# Patient Record
Sex: Female | Born: 1965 | Race: White | Hispanic: No | Marital: Married
Health system: Southern US, Community
[De-identification: ages and names within clinical notes are randomized; demographics above are authoritative.]

---

## 2004-05-31 ENCOUNTER — Ambulatory Visit: Payer: Self-pay

## 2004-10-06 ENCOUNTER — Ambulatory Visit: Payer: Self-pay | Admitting: Internal Medicine

## 2004-11-21 ENCOUNTER — Other Ambulatory Visit: Payer: Self-pay

## 2004-11-24 ENCOUNTER — Ambulatory Visit: Payer: Self-pay | Admitting: Obstetrics and Gynecology

## 2005-06-06 ENCOUNTER — Emergency Department: Payer: Self-pay | Admitting: Emergency Medicine

## 2005-06-06 ENCOUNTER — Other Ambulatory Visit: Payer: Self-pay

## 2005-06-07 ENCOUNTER — Ambulatory Visit: Payer: Self-pay | Admitting: Unknown Physician Specialty

## 2005-07-09 ENCOUNTER — Ambulatory Visit: Payer: Self-pay | Admitting: Gastroenterology

## 2006-05-09 ENCOUNTER — Other Ambulatory Visit: Payer: Self-pay

## 2006-05-17 ENCOUNTER — Ambulatory Visit: Payer: Self-pay | Admitting: Obstetrics and Gynecology

## 2006-08-28 ENCOUNTER — Ambulatory Visit: Payer: Self-pay | Admitting: Obstetrics and Gynecology

## 2007-03-10 ENCOUNTER — Emergency Department: Payer: Self-pay | Admitting: Internal Medicine

## 2007-03-12 ENCOUNTER — Ambulatory Visit: Payer: Self-pay

## 2007-04-29 ENCOUNTER — Ambulatory Visit: Payer: Self-pay | Admitting: Specialist

## 2007-04-29 ENCOUNTER — Other Ambulatory Visit: Payer: Self-pay

## 2007-05-05 ENCOUNTER — Ambulatory Visit: Payer: Self-pay | Admitting: Specialist

## 2007-09-12 ENCOUNTER — Ambulatory Visit: Payer: Self-pay | Admitting: Obstetrics and Gynecology

## 2008-11-29 ENCOUNTER — Ambulatory Visit: Payer: Self-pay | Admitting: Obstetrics and Gynecology

## 2010-03-13 ENCOUNTER — Ambulatory Visit: Payer: Self-pay | Admitting: Obstetrics and Gynecology

## 2011-05-03 ENCOUNTER — Ambulatory Visit: Payer: Self-pay | Admitting: Obstetrics and Gynecology

## 2013-01-26 ENCOUNTER — Ambulatory Visit: Payer: Self-pay | Admitting: Internal Medicine

## 2013-02-02 ENCOUNTER — Ambulatory Visit: Payer: Self-pay | Admitting: Internal Medicine

## 2014-02-03 ENCOUNTER — Ambulatory Visit: Payer: Self-pay | Admitting: Internal Medicine

## 2015-07-27 DIAGNOSIS — E119 Type 2 diabetes mellitus without complications: Secondary | ICD-10-CM | POA: Diagnosis not present

## 2015-09-27 DIAGNOSIS — F411 Generalized anxiety disorder: Secondary | ICD-10-CM | POA: Diagnosis not present

## 2015-09-27 DIAGNOSIS — E119 Type 2 diabetes mellitus without complications: Secondary | ICD-10-CM | POA: Diagnosis not present

## 2015-09-27 DIAGNOSIS — K219 Gastro-esophageal reflux disease without esophagitis: Secondary | ICD-10-CM | POA: Diagnosis not present

## 2015-09-27 DIAGNOSIS — I10 Essential (primary) hypertension: Secondary | ICD-10-CM | POA: Diagnosis not present

## 2015-09-28 ENCOUNTER — Other Ambulatory Visit: Payer: Self-pay | Admitting: Internal Medicine

## 2015-09-28 DIAGNOSIS — Z1231 Encounter for screening mammogram for malignant neoplasm of breast: Secondary | ICD-10-CM

## 2015-10-05 ENCOUNTER — Other Ambulatory Visit: Payer: Self-pay | Admitting: Internal Medicine

## 2015-10-05 ENCOUNTER — Ambulatory Visit
Admission: RE | Admit: 2015-10-05 | Discharge: 2015-10-05 | Disposition: A | Discharge status: Home / Self Care | Payer: 59 | Source: Ambulatory Visit | Attending: Internal Medicine | Admitting: Internal Medicine

## 2015-10-05 DIAGNOSIS — Z1231 Encounter for screening mammogram for malignant neoplasm of breast: Secondary | ICD-10-CM

## 2015-10-28 DIAGNOSIS — E119 Type 2 diabetes mellitus without complications: Secondary | ICD-10-CM | POA: Diagnosis not present

## 2015-10-28 DIAGNOSIS — Z79899 Other long term (current) drug therapy: Secondary | ICD-10-CM | POA: Diagnosis not present

## 2015-10-28 DIAGNOSIS — I10 Essential (primary) hypertension: Secondary | ICD-10-CM | POA: Diagnosis not present

## 2016-04-03 DIAGNOSIS — Z0000 Encounter for general adult medical examination without abnormal findings: Secondary | ICD-10-CM | POA: Diagnosis not present

## 2016-04-03 DIAGNOSIS — Z79899 Other long term (current) drug therapy: Secondary | ICD-10-CM | POA: Diagnosis not present

## 2016-04-03 DIAGNOSIS — E6609 Other obesity due to excess calories: Secondary | ICD-10-CM | POA: Diagnosis not present

## 2016-04-03 DIAGNOSIS — E119 Type 2 diabetes mellitus without complications: Secondary | ICD-10-CM | POA: Diagnosis not present

## 2016-04-03 DIAGNOSIS — Z13220 Encounter for screening for lipoid disorders: Secondary | ICD-10-CM | POA: Diagnosis not present

## 2016-04-03 DIAGNOSIS — I10 Essential (primary) hypertension: Secondary | ICD-10-CM | POA: Diagnosis not present

## 2017-03-19 ENCOUNTER — Other Ambulatory Visit: Payer: Self-pay | Admitting: Internal Medicine

## 2017-03-19 DIAGNOSIS — Z1231 Encounter for screening mammogram for malignant neoplasm of breast: Secondary | ICD-10-CM

## 2017-03-20 DIAGNOSIS — I10 Essential (primary) hypertension: Secondary | ICD-10-CM | POA: Diagnosis not present

## 2017-03-20 DIAGNOSIS — E7800 Pure hypercholesterolemia, unspecified: Secondary | ICD-10-CM | POA: Diagnosis not present

## 2017-03-20 DIAGNOSIS — Z79899 Other long term (current) drug therapy: Secondary | ICD-10-CM | POA: Diagnosis not present

## 2017-03-20 DIAGNOSIS — F329 Major depressive disorder, single episode, unspecified: Secondary | ICD-10-CM | POA: Diagnosis not present

## 2017-03-20 DIAGNOSIS — E119 Type 2 diabetes mellitus without complications: Secondary | ICD-10-CM | POA: Diagnosis not present

## 2017-03-20 DIAGNOSIS — K219 Gastro-esophageal reflux disease without esophagitis: Secondary | ICD-10-CM | POA: Diagnosis not present

## 2017-03-22 DIAGNOSIS — Z1211 Encounter for screening for malignant neoplasm of colon: Secondary | ICD-10-CM | POA: Diagnosis not present

## 2017-04-09 ENCOUNTER — Ambulatory Visit
Admission: RE | Admit: 2017-04-09 | Discharge: 2017-04-09 | Disposition: A | Discharge status: Home / Self Care | Payer: 59 | Source: Ambulatory Visit | Attending: Internal Medicine | Admitting: Internal Medicine

## 2017-04-09 DIAGNOSIS — Z1231 Encounter for screening mammogram for malignant neoplasm of breast: Secondary | ICD-10-CM | POA: Insufficient documentation

## 2017-04-29 DIAGNOSIS — H5213 Myopia, bilateral: Secondary | ICD-10-CM | POA: Diagnosis not present

## 2017-09-25 DIAGNOSIS — I10 Essential (primary) hypertension: Secondary | ICD-10-CM | POA: Diagnosis not present

## 2017-09-25 DIAGNOSIS — E119 Type 2 diabetes mellitus without complications: Secondary | ICD-10-CM | POA: Diagnosis not present

## 2017-09-25 DIAGNOSIS — Z79899 Other long term (current) drug therapy: Secondary | ICD-10-CM | POA: Diagnosis not present

## 2017-09-25 DIAGNOSIS — Z0000 Encounter for general adult medical examination without abnormal findings: Secondary | ICD-10-CM | POA: Diagnosis not present

## 2017-09-25 DIAGNOSIS — E7800 Pure hypercholesterolemia, unspecified: Secondary | ICD-10-CM | POA: Diagnosis not present

## 2018-03-27 DIAGNOSIS — E119 Type 2 diabetes mellitus without complications: Secondary | ICD-10-CM | POA: Diagnosis not present

## 2018-03-27 DIAGNOSIS — E7800 Pure hypercholesterolemia, unspecified: Secondary | ICD-10-CM | POA: Diagnosis not present

## 2018-03-27 DIAGNOSIS — I10 Essential (primary) hypertension: Secondary | ICD-10-CM | POA: Diagnosis not present

## 2018-04-04 DIAGNOSIS — Z79899 Other long term (current) drug therapy: Secondary | ICD-10-CM | POA: Diagnosis not present

## 2018-04-04 DIAGNOSIS — I10 Essential (primary) hypertension: Secondary | ICD-10-CM | POA: Diagnosis not present

## 2018-04-04 DIAGNOSIS — E7800 Pure hypercholesterolemia, unspecified: Secondary | ICD-10-CM | POA: Diagnosis not present

## 2018-04-04 DIAGNOSIS — E119 Type 2 diabetes mellitus without complications: Secondary | ICD-10-CM | POA: Diagnosis not present

## 2018-05-05 DIAGNOSIS — H2513 Age-related nuclear cataract, bilateral: Secondary | ICD-10-CM | POA: Diagnosis not present

## 2018-05-08 ENCOUNTER — Other Ambulatory Visit: Payer: Self-pay | Admitting: Internal Medicine

## 2018-05-08 DIAGNOSIS — Z1231 Encounter for screening mammogram for malignant neoplasm of breast: Secondary | ICD-10-CM

## 2018-05-22 ENCOUNTER — Ambulatory Visit
Admission: RE | Admit: 2018-05-22 | Discharge: 2018-05-22 | Disposition: A | Discharge status: Home / Self Care | Payer: 59 | Source: Ambulatory Visit | Attending: Internal Medicine | Admitting: Internal Medicine

## 2018-05-22 DIAGNOSIS — Z1231 Encounter for screening mammogram for malignant neoplasm of breast: Secondary | ICD-10-CM | POA: Insufficient documentation

## 2018-07-01 DIAGNOSIS — I10 Essential (primary) hypertension: Secondary | ICD-10-CM | POA: Diagnosis not present

## 2018-07-01 DIAGNOSIS — E7800 Pure hypercholesterolemia, unspecified: Secondary | ICD-10-CM | POA: Diagnosis not present

## 2018-07-01 DIAGNOSIS — E119 Type 2 diabetes mellitus without complications: Secondary | ICD-10-CM | POA: Diagnosis not present

## 2018-07-01 DIAGNOSIS — J329 Chronic sinusitis, unspecified: Secondary | ICD-10-CM | POA: Diagnosis not present

## 2019-05-07 ENCOUNTER — Other Ambulatory Visit: Payer: Self-pay | Admitting: Internal Medicine

## 2019-05-07 DIAGNOSIS — Z1231 Encounter for screening mammogram for malignant neoplasm of breast: Secondary | ICD-10-CM

## 2019-05-26 ENCOUNTER — Ambulatory Visit
Admission: RE | Admit: 2019-05-26 | Discharge: 2019-05-26 | Disposition: A | Discharge status: Home / Self Care | Payer: BC Managed Care – PPO | Source: Ambulatory Visit | Attending: Internal Medicine | Admitting: Internal Medicine

## 2019-05-26 DIAGNOSIS — Z1231 Encounter for screening mammogram for malignant neoplasm of breast: Secondary | ICD-10-CM | POA: Insufficient documentation

## 2019-10-20 ENCOUNTER — Other Ambulatory Visit: Payer: Self-pay | Admitting: Internal Medicine

## 2019-11-11 DIAGNOSIS — E1165 Type 2 diabetes mellitus with hyperglycemia: Secondary | ICD-10-CM | POA: Diagnosis not present

## 2019-11-11 DIAGNOSIS — Z79899 Other long term (current) drug therapy: Secondary | ICD-10-CM | POA: Diagnosis not present

## 2019-11-18 ENCOUNTER — Other Ambulatory Visit: Payer: Self-pay | Admitting: Internal Medicine

## 2019-11-18 DIAGNOSIS — E7800 Pure hypercholesterolemia, unspecified: Secondary | ICD-10-CM | POA: Diagnosis not present

## 2019-11-18 DIAGNOSIS — I10 Essential (primary) hypertension: Secondary | ICD-10-CM | POA: Diagnosis not present

## 2019-11-18 DIAGNOSIS — E1165 Type 2 diabetes mellitus with hyperglycemia: Secondary | ICD-10-CM | POA: Diagnosis not present

## 2019-11-25 DIAGNOSIS — H5213 Myopia, bilateral: Secondary | ICD-10-CM | POA: Diagnosis not present

## 2020-02-08 ENCOUNTER — Other Ambulatory Visit: Payer: Self-pay | Admitting: Internal Medicine

## 2020-03-02 DIAGNOSIS — E1165 Type 2 diabetes mellitus with hyperglycemia: Secondary | ICD-10-CM | POA: Diagnosis not present

## 2020-03-02 DIAGNOSIS — Z79899 Other long term (current) drug therapy: Secondary | ICD-10-CM | POA: Diagnosis not present

## 2020-03-04 DIAGNOSIS — Z1211 Encounter for screening for malignant neoplasm of colon: Secondary | ICD-10-CM | POA: Diagnosis not present

## 2020-03-09 ENCOUNTER — Other Ambulatory Visit: Payer: Self-pay | Admitting: Internal Medicine

## 2020-03-09 DIAGNOSIS — E1165 Type 2 diabetes mellitus with hyperglycemia: Secondary | ICD-10-CM | POA: Diagnosis not present

## 2020-03-09 DIAGNOSIS — Z1231 Encounter for screening mammogram for malignant neoplasm of breast: Secondary | ICD-10-CM

## 2020-03-09 DIAGNOSIS — I10 Essential (primary) hypertension: Secondary | ICD-10-CM | POA: Diagnosis not present

## 2020-03-09 DIAGNOSIS — E7800 Pure hypercholesterolemia, unspecified: Secondary | ICD-10-CM | POA: Diagnosis not present

## 2020-08-11 ENCOUNTER — Other Ambulatory Visit: Payer: Self-pay | Admitting: Internal Medicine

## 2020-08-31 DIAGNOSIS — E1165 Type 2 diabetes mellitus with hyperglycemia: Secondary | ICD-10-CM | POA: Diagnosis not present

## 2020-08-31 DIAGNOSIS — Z79899 Other long term (current) drug therapy: Secondary | ICD-10-CM | POA: Diagnosis not present

## 2020-08-31 DIAGNOSIS — I10 Essential (primary) hypertension: Secondary | ICD-10-CM | POA: Diagnosis not present

## 2020-08-31 DIAGNOSIS — E7800 Pure hypercholesterolemia, unspecified: Secondary | ICD-10-CM | POA: Diagnosis not present

## 2020-09-02 ENCOUNTER — Ambulatory Visit
Admission: RE | Admit: 2020-09-02 | Discharge: 2020-09-02 | Disposition: A | Discharge status: Home / Self Care | Payer: 59 | Source: Ambulatory Visit | Attending: Internal Medicine | Admitting: Internal Medicine

## 2020-09-02 ENCOUNTER — Other Ambulatory Visit: Payer: Self-pay

## 2020-09-02 DIAGNOSIS — Z1231 Encounter for screening mammogram for malignant neoplasm of breast: Secondary | ICD-10-CM | POA: Insufficient documentation

## 2020-09-07 ENCOUNTER — Other Ambulatory Visit: Payer: Self-pay | Admitting: Internal Medicine

## 2020-09-07 DIAGNOSIS — I10 Essential (primary) hypertension: Secondary | ICD-10-CM | POA: Diagnosis not present

## 2020-09-07 DIAGNOSIS — Z1211 Encounter for screening for malignant neoplasm of colon: Secondary | ICD-10-CM | POA: Diagnosis not present

## 2020-09-07 DIAGNOSIS — E118 Type 2 diabetes mellitus with unspecified complications: Secondary | ICD-10-CM | POA: Diagnosis not present

## 2020-09-07 DIAGNOSIS — Z0000 Encounter for general adult medical examination without abnormal findings: Secondary | ICD-10-CM | POA: Diagnosis not present

## 2020-11-10 ENCOUNTER — Other Ambulatory Visit: Payer: Self-pay

## 2020-11-10 MED FILL — Diltiazem HCl Cap ER 24HR 240 MG: ORAL | 90 days supply | Qty: 90 | Fill #0 | Status: AC

## 2020-11-10 MED FILL — Losartan Potassium & Hydrochlorothiazide Tab 50-12.5 MG: ORAL | 90 days supply | Qty: 90 | Fill #0 | Status: AC

## 2020-11-10 MED FILL — Omeprazole Cap Delayed Release 40 MG: ORAL | 90 days supply | Qty: 90 | Fill #0 | Status: AC

## 2020-11-10 MED FILL — Metoprolol Succinate Tab ER 24HR 50 MG (Tartrate Equiv): ORAL | 90 days supply | Qty: 90 | Fill #0 | Status: AC

## 2020-11-10 MED FILL — Dulaglutide Soln Auto-injector 1.5 MG/0.5ML: SUBCUTANEOUS | 84 days supply | Qty: 6 | Fill #0 | Status: AC

## 2020-11-25 DIAGNOSIS — H2513 Age-related nuclear cataract, bilateral: Secondary | ICD-10-CM | POA: Diagnosis not present

## 2021-01-03 ENCOUNTER — Other Ambulatory Visit: Payer: Self-pay

## 2021-01-03 MED FILL — Dulaglutide Soln Auto-injector 1.5 MG/0.5ML: SUBCUTANEOUS | 84 days supply | Qty: 6 | Fill #1 | Status: CN

## 2021-01-03 MED FILL — Metoprolol Succinate Tab ER 24HR 50 MG (Tartrate Equiv): ORAL | 90 days supply | Qty: 90 | Fill #1 | Status: CN

## 2021-01-03 MED FILL — Diltiazem HCl Cap ER 24HR 240 MG: ORAL | 90 days supply | Qty: 90 | Fill #1 | Status: CN

## 2021-01-03 MED FILL — Omeprazole Cap Delayed Release 40 MG: ORAL | 90 days supply | Qty: 90 | Fill #1 | Status: CN

## 2021-01-03 MED FILL — Losartan Potassium & Hydrochlorothiazide Tab 50-12.5 MG: ORAL | 90 days supply | Qty: 90 | Fill #1 | Status: CN

## 2021-03-14 ENCOUNTER — Other Ambulatory Visit: Payer: Self-pay

## 2021-03-14 MED FILL — Diltiazem HCl Cap ER 24HR 240 MG: ORAL | 90 days supply | Qty: 90 | Fill #1 | Status: AC

## 2021-04-11 ENCOUNTER — Other Ambulatory Visit: Payer: Self-pay

## 2021-04-11 MED FILL — Losartan Potassium & Hydrochlorothiazide Tab 50-12.5 MG: ORAL | 90 days supply | Qty: 90 | Fill #1 | Status: AC

## 2021-04-11 MED FILL — Omeprazole Cap Delayed Release 40 MG: ORAL | 90 days supply | Qty: 90 | Fill #1 | Status: AC

## 2021-05-03 ENCOUNTER — Other Ambulatory Visit: Payer: Self-pay

## 2021-05-03 MED ORDER — TRULICITY 1.5 MG/0.5ML ~~LOC~~ SOAJ
SUBCUTANEOUS | 3 refills | Status: DC
  Filled 2021-05-03: qty 6, 84d supply, fill #0
  Filled 2021-05-30: qty 2, 28d supply, fill #0

## 2021-05-30 ENCOUNTER — Other Ambulatory Visit: Payer: Self-pay

## 2021-05-30 MED FILL — Metoprolol Succinate Tab ER 24HR 50 MG (Tartrate Equiv): ORAL | 90 days supply | Qty: 90 | Fill #1 | Status: AC

## 2021-06-02 ENCOUNTER — Other Ambulatory Visit: Payer: Self-pay

## 2021-06-02 MED FILL — Diltiazem HCl Cap ER 24HR 240 MG: ORAL | 90 days supply | Qty: 90 | Fill #2 | Status: AC

## 2021-08-31 ENCOUNTER — Other Ambulatory Visit: Payer: Self-pay

## 2021-08-31 MED FILL — Losartan Potassium & Hydrochlorothiazide Tab 50-12.5 MG: ORAL | 90 days supply | Qty: 90 | Fill #2 | Status: AC

## 2021-08-31 MED FILL — Diltiazem HCl Cap ER 24HR 240 MG: ORAL | 90 days supply | Qty: 90 | Fill #3 | Status: AC

## 2021-08-31 MED FILL — Metoprolol Succinate Tab ER 24HR 50 MG (Tartrate Equiv): ORAL | 90 days supply | Qty: 90 | Fill #2 | Status: AC

## 2021-08-31 MED FILL — Dulaglutide Soln Auto-injector 1.5 MG/0.5ML: SUBCUTANEOUS | 28 days supply | Qty: 2 | Fill #1 | Status: AC

## 2021-09-05 ENCOUNTER — Other Ambulatory Visit: Payer: Self-pay

## 2021-09-05 MED FILL — Omeprazole Cap Delayed Release 40 MG: ORAL | 90 days supply | Qty: 90 | Fill #2 | Status: AC

## 2021-10-10 ENCOUNTER — Other Ambulatory Visit: Payer: Self-pay

## 2021-10-10 ENCOUNTER — Other Ambulatory Visit: Payer: Self-pay | Admitting: Internal Medicine

## 2021-10-10 DIAGNOSIS — Z1231 Encounter for screening mammogram for malignant neoplasm of breast: Secondary | ICD-10-CM

## 2021-10-10 MED ORDER — DILTIAZEM HCL ER 240 MG PO CP24
ORAL_CAPSULE | ORAL | 3 refills | Status: DC
  Filled 2021-10-10 – 2021-12-12 (×2): qty 90, 90d supply, fill #0
  Filled 2022-03-22: qty 90, 90d supply, fill #1
  Filled 2022-06-18: qty 11, 11d supply, fill #2
  Filled 2022-06-18: qty 90, 90d supply, fill #2
  Filled 2022-06-18: qty 79, 79d supply, fill #2
  Filled 2022-09-17: qty 90, 90d supply, fill #3

## 2021-10-10 MED ORDER — TRULICITY 3 MG/0.5ML ~~LOC~~ SOAJ
SUBCUTANEOUS | 3 refills | Status: DC
  Filled 2021-10-10: qty 2, 28d supply, fill #0
  Filled 2022-01-03: qty 2, 28d supply, fill #1
  Filled 2022-03-22: qty 2, 28d supply, fill #2
  Filled 2022-06-18: qty 2, 28d supply, fill #3

## 2021-10-10 MED ORDER — LOSARTAN POTASSIUM-HCTZ 50-12.5 MG PO TABS
1.0000 | ORAL_TABLET | Freq: Every day | ORAL | 3 refills | Status: DC
  Filled 2021-10-10 – 2022-03-22 (×2): qty 90, 90d supply, fill #0

## 2021-10-10 MED ORDER — OMEPRAZOLE 40 MG PO CPDR
DELAYED_RELEASE_CAPSULE | ORAL | 3 refills | Status: DC
  Filled 2021-10-10 – 2021-12-12 (×2): qty 90, 90d supply, fill #0
  Filled 2022-03-22: qty 90, 90d supply, fill #1

## 2021-10-10 MED ORDER — TRULICITY 1.5 MG/0.5ML ~~LOC~~ SOAJ
SUBCUTANEOUS | 3 refills | Status: DC
  Filled 2021-10-10: qty 6, 84d supply, fill #0

## 2021-10-10 MED ORDER — METOPROLOL SUCCINATE ER 50 MG PO TB24
50.0000 mg | ORAL_TABLET | Freq: Every day | ORAL | 3 refills | Status: DC
  Filled 2021-10-10 – 2021-12-12 (×2): qty 90, 90d supply, fill #0
  Filled 2022-03-22: qty 90, 90d supply, fill #1
  Filled 2022-06-18: qty 90, 90d supply, fill #2
  Filled 2022-09-17: qty 90, 90d supply, fill #3

## 2021-11-15 ENCOUNTER — Ambulatory Visit
Admission: RE | Admit: 2021-11-15 | Discharge: 2021-11-15 | Disposition: A | Discharge status: Home / Self Care | Payer: BC Managed Care – PPO | Source: Ambulatory Visit | Attending: Internal Medicine | Admitting: Internal Medicine

## 2021-11-15 DIAGNOSIS — Z1231 Encounter for screening mammogram for malignant neoplasm of breast: Secondary | ICD-10-CM | POA: Diagnosis not present

## 2021-12-12 ENCOUNTER — Other Ambulatory Visit: Payer: Self-pay

## 2022-01-03 ENCOUNTER — Other Ambulatory Visit: Payer: Self-pay

## 2022-02-07 ENCOUNTER — Encounter: Payer: Self-pay | Admitting: Dermatology

## 2022-02-07 ENCOUNTER — Ambulatory Visit: Payer: BC Managed Care – PPO | Admitting: Dermatology

## 2022-02-07 DIAGNOSIS — L820 Inflamed seborrheic keratosis: Secondary | ICD-10-CM

## 2022-02-07 DIAGNOSIS — Z1283 Encounter for screening for malignant neoplasm of skin: Secondary | ICD-10-CM | POA: Diagnosis not present

## 2022-02-07 DIAGNOSIS — D229 Melanocytic nevi, unspecified: Secondary | ICD-10-CM

## 2022-02-07 DIAGNOSIS — L821 Other seborrheic keratosis: Secondary | ICD-10-CM | POA: Diagnosis not present

## 2022-02-07 DIAGNOSIS — L814 Other melanin hyperpigmentation: Secondary | ICD-10-CM

## 2022-02-07 DIAGNOSIS — L578 Other skin changes due to chronic exposure to nonionizing radiation: Secondary | ICD-10-CM | POA: Diagnosis not present

## 2022-02-07 DIAGNOSIS — D1800 Hemangioma unspecified site: Secondary | ICD-10-CM

## 2022-02-07 NOTE — Patient Instructions (Addendum)
Cryotherapy Aftercare  Wash gently with soap and water everyday.   Apply Vaseline and Band-Aid daily until healed.     Due to recent changes in healthcare laws, you may see results of your pathology and/or laboratory studies on MyChart before the doctors have had a chance to review them. We understand that in some cases there may be results that are confusing or concerning to you. Please understand that not all results are received at the same time and often the doctors may need to interpret multiple results in order to provide you with the best plan of care or course of treatment. Therefore, we ask that you please give us 2 business days to thoroughly review all your results before contacting the office for clarification. Should we see a critical lab result, you will be contacted sooner.   If You Need Anything After Your Visit  If you have any questions or concerns for your doctor, please call our main line at [phone] and press option 4 to reach your doctor's medical assistant. If no one answers, please leave a voicemail as directed and we will return your call as soon as possible. Messages left after 4 pm will be answered the following business day.   You may also send us a message via MyChart. We typically respond to MyChart messages within 1-2 business days.  For prescription refills, please ask your pharmacy to contact our office. Our fax number is [phone].  If you have an urgent issue when the clinic is closed that cannot wait until the next business day, you can page your doctor at the number below.    Please note that while we do our best to be available for urgent issues outside of office hours, we are not available 24/7.   If you have an urgent issue and are unable to reach us, you may choose to seek medical care at your doctor's office, retail clinic, urgent care center, or emergency room.  If you have a medical emergency, please immediately call 911 or go to the  emergency department.  Pager Numbers  - Dr. Kowalski: [phone]  - Dr. Moye: [phone]  - Dr. Stewart: [phone]  In the event of inclement weather, please call our main line at [phone] for an update on the status of any delays or closures.  Dermatology Medication Tips: Please keep the boxes that topical medications come in in order to help keep track of the instructions about where and how to use these. Pharmacies typically print the medication instructions only on the boxes and not directly on the medication tubes.   If your medication is too expensive, please contact our office at [phone] option 4 or send us a message through MyChart.   We are unable to tell what your co-pay for medications will be in advance as this is different depending on your insurance coverage. However, we may be able to find a substitute medication at lower cost or fill out paperwork to get insurance to cover a needed medication.   If a prior authorization is required to get your medication covered by your insurance company, please allow us 1-2 business days to complete this process.  Drug prices often vary depending on where the prescription is filled and some pharmacies may offer cheaper prices.  The website www.goodrx.com contains coupons for medications through different pharmacies. The prices here do not account for what the cost may be with help from insurance (it may be cheaper with your insurance), but the website can   give you the price if you did not use any insurance.  - You can print the associated coupon and take it with your prescription to the pharmacy.  - You may also stop by our office during regular business hours and pick up a GoodRx coupon card.  - If you need your prescription sent electronically to a different pharmacy, notify our office through [facility] MyChart or by phone at [phone] option 4.     Si Usted Necesita Algo Despus de Su Visita  Tambin puede  enviarnos un mensaje a travs de MyChart. Por lo general respondemos a los mensajes de MyChart en el transcurso de 1 a 2 das hbiles.  Para renovar recetas, por favor pida a su farmacia que se ponga en contacto con nuestra oficina. Nuestro nmero de fax es el [phone].  Si tiene un asunto urgente cuando la clnica est cerrada y que no puede esperar hasta el siguiente da hbil, puede llamar/localizar a su doctor(a) al nmero que aparece a continuacin.   Por favor, tenga en cuenta que aunque hacemos todo lo posible para estar disponibles para asuntos urgentes fuera del horario de oficina, no estamos disponibles las 24 horas del da, los 7 das de la semana.   Si tiene un problema urgente y no puede comunicarse con nosotros, puede optar por buscar atencin mdica  en el consultorio de su doctor(a), en una clnica privada, en un centro de atencin urgente o en una sala de emergencias.  Si tiene una emergencia mdica, por favor llame inmediatamente al 911 o vaya a la sala de emergencias.  Nmeros de bper  - Dr. Kowalski: [phone]  - Dra. Moye: [phone]  - Dra. Stewart: [phone]  En caso de inclemencias del tiempo, por favor llame a nuestra lnea principal al [phone] para una actualizacin sobre el estado de cualquier retraso o cierre.  Consejos para la medicacin en dermatologa: Por favor, guarde las cajas en las que vienen los medicamentos de uso tpico para ayudarle a seguir las instrucciones sobre dnde y cmo usarlos. Las farmacias generalmente imprimen las instrucciones del medicamento slo en las cajas y no directamente en los tubos del medicamento.   Si su medicamento es muy caro, por favor, pngase en contacto con nuestra oficina llamando al [phone] y presione la opcin 4 o envenos un mensaje a travs de MyChart.   No podemos decirle cul ser su copago por los medicamentos por adelantado ya que esto es diferente dependiendo de la cobertura de su seguro.  Sin embargo, es posible que podamos encontrar un medicamento sustituto a menor costo o llenar un formulario para que el seguro cubra el medicamento que se considera necesario.   Si se requiere una autorizacin previa para que su compaa de seguros cubra su medicamento, por favor permtanos de 1 a 2 das hbiles para completar este proceso.  Los precios de los medicamentos varan con frecuencia dependiendo del lugar de dnde se surte la receta y alguna farmacias pueden ofrecer precios ms baratos.  El sitio web www.goodrx.com tiene cupones para medicamentos de diferentes farmacias. Los precios aqu no tienen en cuenta lo que podra costar con la ayuda del seguro (puede ser ms barato con su seguro), pero el sitio web puede darle el precio si no utiliz ningn seguro.  - Puede imprimir el cupn correspondiente y llevarlo con su receta a la farmacia.  - Tambin puede pasar por nuestra oficina durante el horario de atencin regular y recoger una tarjeta de cupones de GoodRx.  -   Si necesita que su receta se enve electrnicamente a una farmacia diferente, informe a nuestra oficina a travs de MyChart de [place] o por telfono llamando al [phone] y presione la opcin 4.  

## 2022-02-07 NOTE — Progress Notes (Signed)
   New Patient Visit  Subjective  Alyssa Moses is a 56 y.o. female who presents for the following: Annual Exam (Mole check ).  Pt concerned about moles changing on the left arm and left thigh.  The patient presents for Total-Body Skin Exam (TBSE) for skin cancer screening and mole check.  The patient has spots, moles and lesions to be evaluated, some may be new or changing and the patient has concerns that these could be cancer.   The following portions of the chart were reviewed this encounter and updated as appropriate:   Allergies  Meds  Problems  Med Hx  Surg Hx  Fam Hx     Review of Systems:  No other skin or systemic complaints except as noted in HPI or Assessment and Plan.  Objective  Well appearing patient in no apparent distress; mood and affect are within normal limits.  A full examination was performed including scalp, head, eyes, ears, nose, lips, neck, chest, axillae, abdomen, back, buttocks, bilateral upper extremities, bilateral lower extremities, hands, feet, fingers, toes, fingernails, and toenails. All findings within normal limits unless otherwise noted below.  left arm x 1, left thigh x 1  (2) (2) Stuck-on, waxy, tan-brown papules -- Discussed benign etiology and prognosis.    Assessment & Plan  Inflamed seborrheic keratosis (2) left arm x 1, left thigh x 1  (2)  Symptomatic, irritating, patient would like treated.   Destruction of lesion - left arm x 1, left thigh x 1  (2) Complexity: simple   Destruction method: cryotherapy   Informed consent: discussed and consent obtained   Timeout:  patient name, date of birth, surgical site, and procedure verified Lesion destroyed using liquid nitrogen: Yes   Region frozen until ice ball extended beyond lesion: Yes   Outcome: patient tolerated procedure well with no complications   Post-procedure details: wound care instructions given    Lentigines - Scattered tan macules - Due to sun exposure -  Benign-appearing, observe - Recommend daily broad spectrum sunscreen SPF 30+ to sun-exposed areas, reapply every 2 hours as needed. - Call for any changes  Seborrheic Keratoses - Stuck-on, waxy, tan-brown papules and/or plaques  - Benign-appearing - Discussed benign etiology and prognosis. - Observe - Call for any changes  Melanocytic Nevi - Tan-brown and/or pink-flesh-colored symmetric macules and papules - Benign appearing on exam today - Observation - Call clinic for new or changing moles - Recommend daily use of broad spectrum spf 30+ sunscreen to sun-exposed areas.   Hemangiomas - Red papules - Discussed benign nature - Observe - Call for any changes  Actinic Damage - Chronic condition, secondary to cumulative UV/sun exposure - diffuse scaly erythematous macules with underlying dyspigmentation - Recommend daily broad spectrum sunscreen SPF 30+ to sun-exposed areas, reapply every 2 hours as needed.  - Staying in the shade or wearing long sleeves, sun glasses (UVA+UVB protection) and wide brim hats (4-inch brim around the entire circumference of the hat) are also recommended for sun protection.  - Call for new or changing lesions.  Skin cancer screening performed today.   Return in about 1 year (around 02/08/2023) for TBSE, ISK's.  IMarye Round, CMA, am acting as scribe for Sarina Ser, MD .  Documentation: I have reviewed the above documentation for accuracy and completeness, and I agree with the above.  Sarina Ser, MD

## 2022-02-14 IMAGING — MG MM DIGITAL SCREENING BILAT W/ TOMO AND CAD
8 series · 8 of 24 positions shown · non-contrast
Comparison: Previous exam(s).

CLINICAL DATA: Screening.

EXAM:
DIGITAL SCREENING BILATERAL MAMMOGRAM WITH TOMOSYNTHESIS AND CAD
TECHNIQUE: Bilateral screening digital craniocaudal and mediolateral oblique
mammograms were obtained. Bilateral screening digital breast
tomosynthesis was performed. The images were evaluated with
computer-aided detection.

[R CC synth-2D]
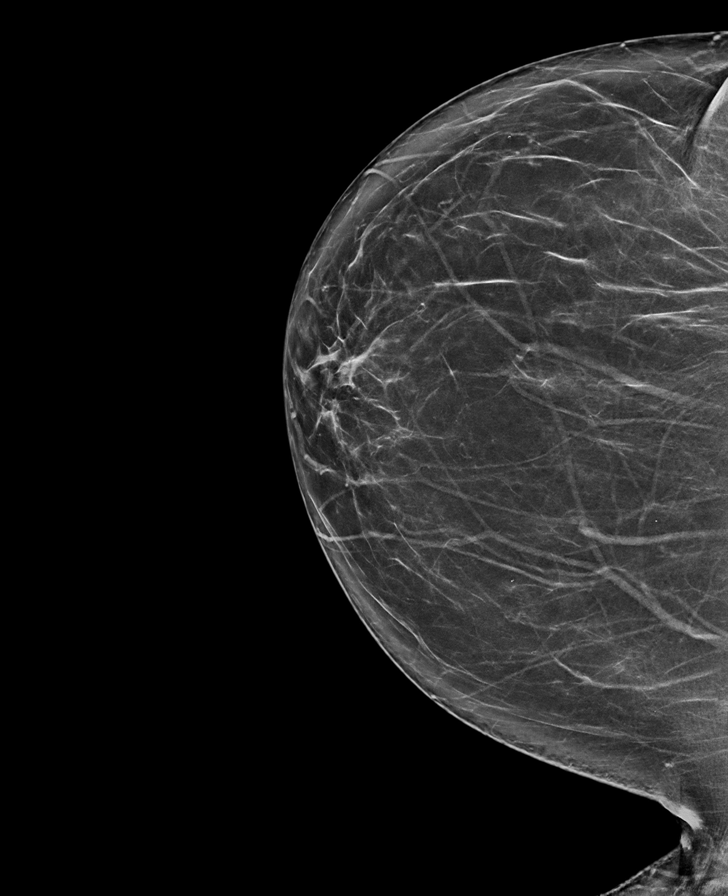

[L CC synth-2D]
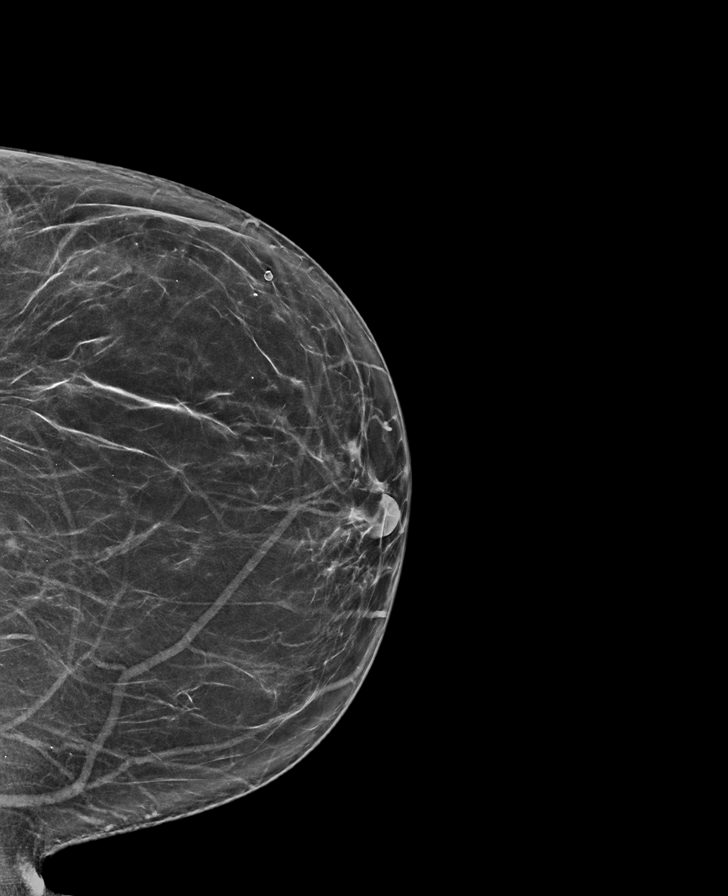

[L MLO synth-2D]
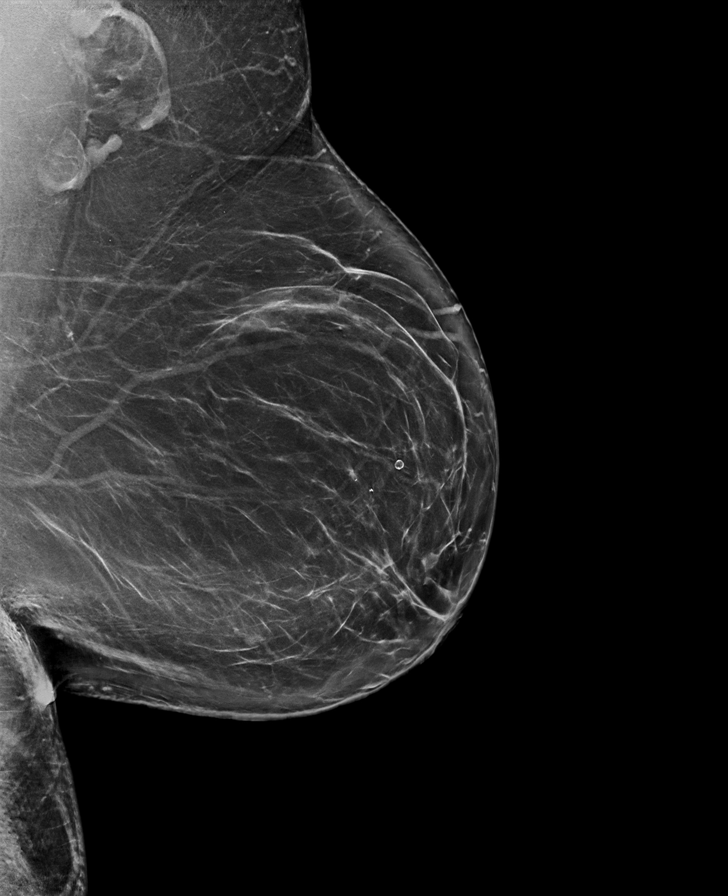

[R MLO synth-2D]
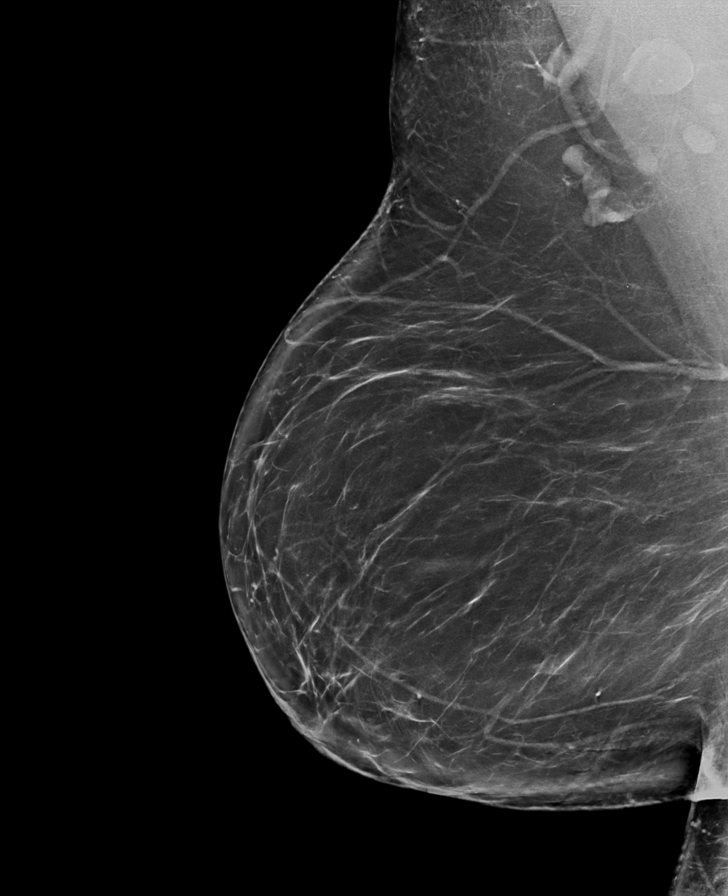

[L MLO tomo · tomo slice 47/94.0]
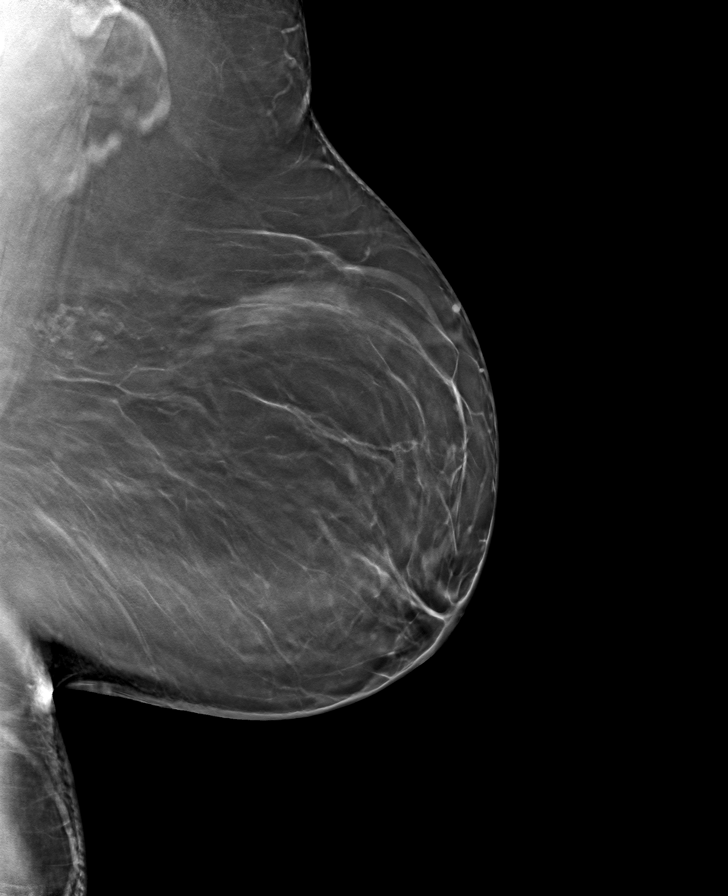

[R MLO tomo · tomo slice 49/98.0]
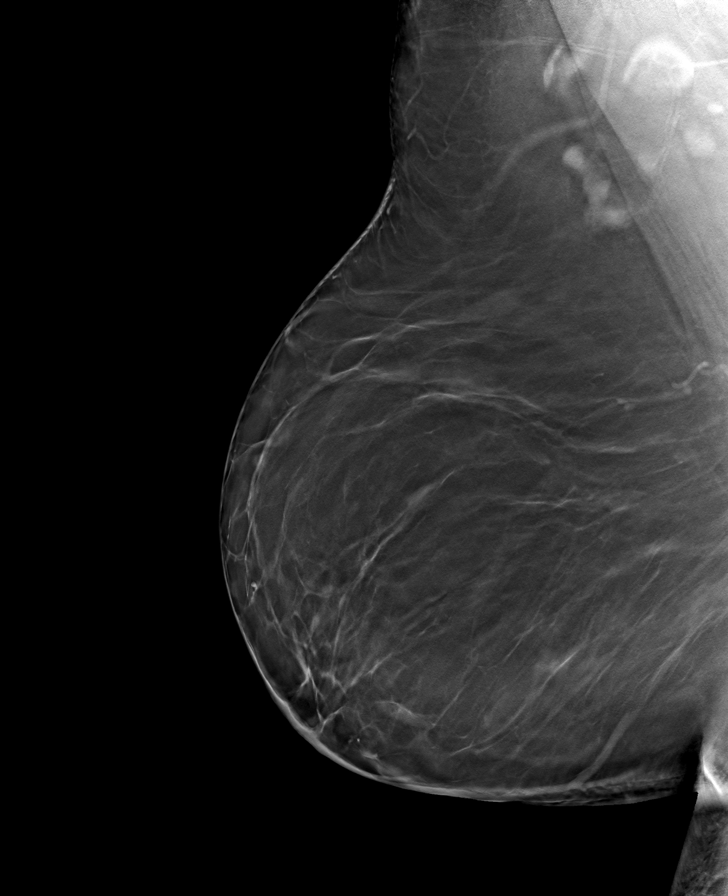

[L CC tomo · tomo slice 37/74.0]
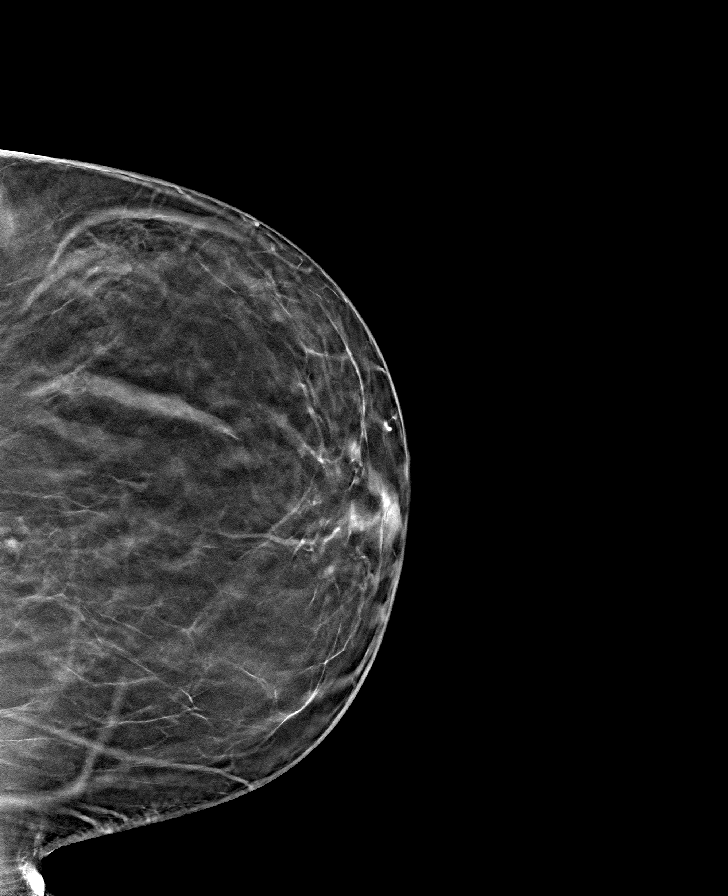

[R CC tomo · tomo slice 41/81.0]
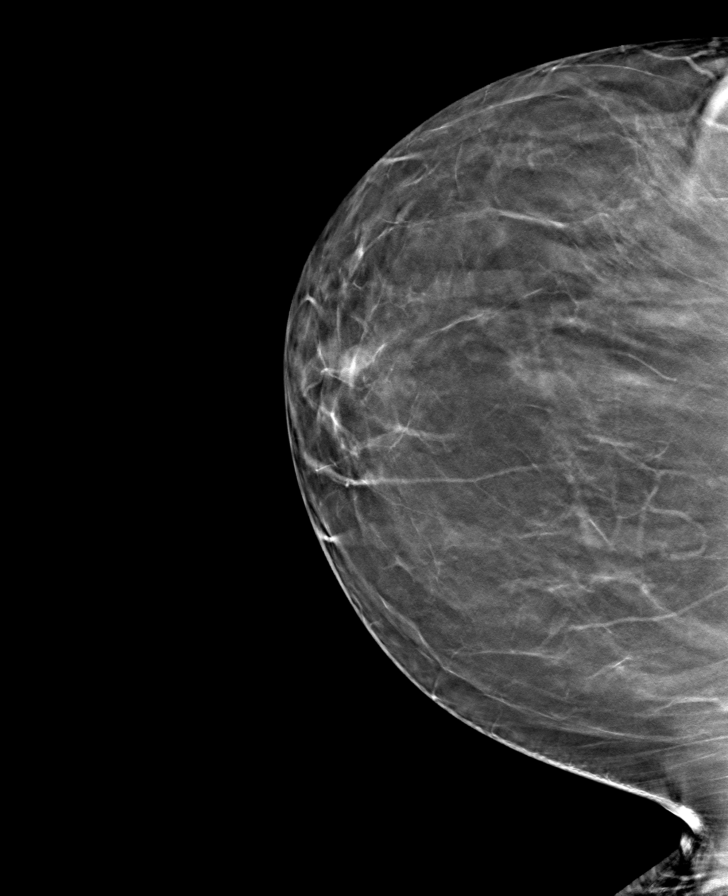

[8 of 24 positions shown; findings below may reference images not displayed]

ACR Breast Density Category b: There are scattered areas of
fibroglandular density.
FINDINGS: There are no findings suspicious for malignancy. The images were
evaluated with computer-aided detection.
IMPRESSION: No mammographic evidence of malignancy. A result letter of this
screening mammogram will be mailed directly to the patient.

RECOMMENDATION:
Screening mammogram in one year. (Code:WJ-[id number])

BI-RADS CATEGORY  1: Negative.

## 2022-02-28 ENCOUNTER — Other Ambulatory Visit: Payer: Self-pay

## 2022-03-22 ENCOUNTER — Other Ambulatory Visit: Payer: Self-pay

## 2022-03-27 ENCOUNTER — Other Ambulatory Visit: Payer: Self-pay

## 2022-03-29 ENCOUNTER — Other Ambulatory Visit: Payer: Self-pay

## 2022-03-30 ENCOUNTER — Other Ambulatory Visit: Payer: Self-pay

## 2022-03-30 MED ORDER — FREESTYLE LITE TEST VI STRP
ORAL_STRIP | 11 refills | Status: DC
  Filled 2022-03-30: qty 100, 90d supply, fill #0
  Filled 2022-10-17: qty 100, 90d supply, fill #1

## 2022-04-03 ENCOUNTER — Other Ambulatory Visit: Payer: Self-pay

## 2022-06-18 ENCOUNTER — Other Ambulatory Visit: Payer: Self-pay

## 2022-06-27 ENCOUNTER — Other Ambulatory Visit: Payer: Self-pay

## 2022-06-27 MED ORDER — GUAIFENESIN-CODEINE 100-10 MG/5ML PO SOLN
5.0000 mL | Freq: Four times a day (QID) | ORAL | 0 refills | Status: DC | PRN
  Filled 2022-06-27: qty 118, 6d supply, fill #0

## 2022-06-27 MED ORDER — PAXLOVID (300/100) 20 X 150 MG & 10 X 100MG PO TBPK
ORAL_TABLET | ORAL | 0 refills | Status: DC
  Filled 2022-06-27: qty 30, 5d supply, fill #0

## 2022-06-27 MED ORDER — AMOXICILLIN-POT CLAVULANATE 875-125 MG PO TABS
1.0000 | ORAL_TABLET | Freq: Two times a day (BID) | ORAL | 0 refills | Status: DC
  Filled 2022-06-27: qty 20, 10d supply, fill #0

## 2022-06-27 MED ORDER — PREDNISONE 20 MG PO TABS
ORAL_TABLET | ORAL | 0 refills | Status: DC
  Filled 2022-06-27: qty 12, 6d supply, fill #0

## 2022-07-18 ENCOUNTER — Other Ambulatory Visit (HOSPITAL_COMMUNITY): Payer: Self-pay

## 2022-09-17 ENCOUNTER — Other Ambulatory Visit: Payer: Self-pay

## 2022-10-17 ENCOUNTER — Other Ambulatory Visit: Payer: Self-pay

## 2022-10-17 MED ORDER — DILTIAZEM HCL ER 240 MG PO CP24
240.0000 mg | ORAL_CAPSULE | Freq: Every day | ORAL | 3 refills | Status: DC
  Filled 2022-10-17 – 2022-12-14 (×2): qty 90, 90d supply, fill #0
  Filled 2023-03-12: qty 90, 90d supply, fill #1
  Filled 2023-06-14: qty 90, 90d supply, fill #2
  Filled 2023-09-12: qty 90, 90d supply, fill #3

## 2022-10-17 MED ORDER — METOPROLOL SUCCINATE ER 50 MG PO TB24
50.0000 mg | ORAL_TABLET | Freq: Every day | ORAL | 3 refills | Status: DC
  Filled 2022-10-17 – 2022-12-14 (×2): qty 90, 90d supply, fill #0
  Filled 2023-03-12 (×2): qty 90, 90d supply, fill #1
  Filled 2023-06-14: qty 90, 90d supply, fill #2
  Filled 2023-09-12: qty 90, 90d supply, fill #3

## 2022-10-17 MED ORDER — LOSARTAN POTASSIUM-HCTZ 50-12.5 MG PO TABS
1.0000 | ORAL_TABLET | Freq: Every day | ORAL | 3 refills | Status: DC
  Filled 2022-10-17 – 2022-12-14 (×2): qty 90, 90d supply, fill #0

## 2022-10-18 ENCOUNTER — Other Ambulatory Visit: Payer: Self-pay

## 2022-10-18 ENCOUNTER — Other Ambulatory Visit: Payer: Self-pay | Admitting: Internal Medicine

## 2022-10-18 DIAGNOSIS — Z1231 Encounter for screening mammogram for malignant neoplasm of breast: Secondary | ICD-10-CM

## 2022-10-18 MED ORDER — GLIPIZIDE ER 10 MG PO TB24
10.0000 mg | ORAL_TABLET | Freq: Every day | ORAL | 3 refills | Status: DC
  Filled 2022-10-18: qty 90, 90d supply, fill #0
  Filled 2023-01-14: qty 90, 90d supply, fill #1
  Filled 2023-04-11: qty 90, 90d supply, fill #2
  Filled 2023-07-18: qty 90, 90d supply, fill #3

## 2022-12-14 ENCOUNTER — Other Ambulatory Visit: Payer: Self-pay

## 2022-12-25 ENCOUNTER — Ambulatory Visit
Admission: RE | Admit: 2022-12-25 | Discharge: 2022-12-25 | Disposition: A | Discharge status: Home / Self Care | Payer: BC Managed Care – PPO | Source: Ambulatory Visit | Attending: Internal Medicine | Admitting: Internal Medicine

## 2022-12-25 DIAGNOSIS — Z1231 Encounter for screening mammogram for malignant neoplasm of breast: Secondary | ICD-10-CM | POA: Diagnosis present

## 2022-12-31 ENCOUNTER — Other Ambulatory Visit: Payer: Self-pay | Admitting: Internal Medicine

## 2022-12-31 DIAGNOSIS — N63 Unspecified lump in unspecified breast: Secondary | ICD-10-CM

## 2022-12-31 DIAGNOSIS — R928 Other abnormal and inconclusive findings on diagnostic imaging of breast: Secondary | ICD-10-CM

## 2023-01-02 ENCOUNTER — Ambulatory Visit
Admission: RE | Admit: 2023-01-02 | Discharge: 2023-01-02 | Disposition: A | Discharge status: Home / Self Care | Payer: BC Managed Care – PPO | Source: Ambulatory Visit | Attending: Internal Medicine | Admitting: Internal Medicine

## 2023-01-02 DIAGNOSIS — R928 Other abnormal and inconclusive findings on diagnostic imaging of breast: Secondary | ICD-10-CM | POA: Diagnosis present

## 2023-01-02 DIAGNOSIS — N63 Unspecified lump in unspecified breast: Secondary | ICD-10-CM | POA: Diagnosis present

## 2023-01-07 ENCOUNTER — Other Ambulatory Visit: Payer: Self-pay | Admitting: Internal Medicine

## 2023-01-07 DIAGNOSIS — N63 Unspecified lump in unspecified breast: Secondary | ICD-10-CM

## 2023-01-07 DIAGNOSIS — R928 Other abnormal and inconclusive findings on diagnostic imaging of breast: Secondary | ICD-10-CM

## 2023-01-09 ENCOUNTER — Ambulatory Visit
Admission: RE | Admit: 2023-01-09 | Discharge: 2023-01-09 | Disposition: A | Discharge status: Home / Self Care | Payer: BC Managed Care – PPO | Source: Ambulatory Visit | Attending: Internal Medicine | Admitting: Internal Medicine

## 2023-01-09 DIAGNOSIS — R928 Other abnormal and inconclusive findings on diagnostic imaging of breast: Secondary | ICD-10-CM

## 2023-01-09 DIAGNOSIS — N63 Unspecified lump in unspecified breast: Secondary | ICD-10-CM

## 2023-01-09 HISTORY — PX: BREAST BIOPSY: SHX20

## 2023-01-09 MED ORDER — LIDOCAINE-EPINEPHRINE 1 %-1:100000 IJ SOLN
5.0000 mL | Freq: Once | INTRAMUSCULAR | Status: AC
  Administered 2023-01-09: 5 mL via INTRADERMAL
  Filled 2023-01-09: qty 5

## 2023-01-09 MED ORDER — LIDOCAINE 1 % OPTIME INJ - NO CHARGE
2.0000 mL | Freq: Once | INTRAMUSCULAR | Status: AC
  Administered 2023-01-09: 2 mL via INTRADERMAL
  Filled 2023-01-09: qty 2

## 2023-01-14 ENCOUNTER — Other Ambulatory Visit: Payer: Self-pay

## 2023-03-12 ENCOUNTER — Other Ambulatory Visit: Payer: Self-pay

## 2023-04-10 ENCOUNTER — Other Ambulatory Visit: Payer: Self-pay

## 2023-04-10 MED ORDER — GLIPIZIDE ER 5 MG PO TB24
5.0000 mg | ORAL_TABLET | Freq: Every day | ORAL | 3 refills | Status: DC
  Filled 2023-04-10: qty 90, 90d supply, fill #0

## 2023-04-11 ENCOUNTER — Other Ambulatory Visit: Payer: Self-pay

## 2023-06-14 ENCOUNTER — Other Ambulatory Visit: Payer: Self-pay

## 2023-06-18 ENCOUNTER — Other Ambulatory Visit: Payer: Self-pay

## 2023-07-18 ENCOUNTER — Other Ambulatory Visit: Payer: Self-pay

## 2023-09-12 ENCOUNTER — Other Ambulatory Visit: Payer: Self-pay

## 2023-09-13 ENCOUNTER — Other Ambulatory Visit: Payer: Self-pay

## 2023-10-14 ENCOUNTER — Other Ambulatory Visit: Payer: Self-pay

## 2023-10-14 MED ORDER — GLIPIZIDE ER 10 MG PO TB24
10.0000 mg | ORAL_TABLET | Freq: Every day | ORAL | 3 refills | Status: DC
  Filled 2023-10-14: qty 90, 90d supply, fill #0

## 2023-10-14 MED ORDER — LOSARTAN POTASSIUM-HCTZ 50-12.5 MG PO TABS
1.0000 | ORAL_TABLET | Freq: Every day | ORAL | 3 refills | Status: DC
  Filled 2023-10-14: qty 90, 90d supply, fill #0

## 2023-10-14 MED ORDER — METFORMIN HCL ER 500 MG PO TB24
1000.0000 mg | ORAL_TABLET | Freq: Every day | ORAL | 5 refills | Status: DC
  Filled 2023-10-14: qty 60, 30d supply, fill #0
  Filled 2023-11-28: qty 60, 30d supply, fill #1
  Filled 2023-12-25: qty 60, 30d supply, fill #2

## 2023-10-14 MED ORDER — DILTIAZEM HCL ER 240 MG PO CP24
240.0000 mg | ORAL_CAPSULE | Freq: Every day | ORAL | 3 refills | Status: AC
  Filled 2023-10-14 – 2023-12-17 (×2): qty 90, 90d supply, fill #0
  Filled 2024-03-16 (×2): qty 90, 90d supply, fill #1
  Filled 2024-06-12: qty 90, 90d supply, fill #2

## 2023-10-14 MED ORDER — METOPROLOL SUCCINATE ER 50 MG PO TB24
50.0000 mg | ORAL_TABLET | Freq: Every day | ORAL | 3 refills | Status: AC
  Filled 2023-10-14 – 2023-12-17 (×2): qty 90, 90d supply, fill #0
  Filled 2024-03-16: qty 90, 90d supply, fill #1
  Filled 2024-06-12: qty 90, 90d supply, fill #2

## 2023-12-17 ENCOUNTER — Other Ambulatory Visit: Payer: Self-pay

## 2023-12-25 ENCOUNTER — Other Ambulatory Visit: Payer: Self-pay

## 2024-01-07 ENCOUNTER — Other Ambulatory Visit: Payer: Self-pay | Admitting: Internal Medicine

## 2024-01-07 DIAGNOSIS — Z1231 Encounter for screening mammogram for malignant neoplasm of breast: Secondary | ICD-10-CM

## 2024-01-08 ENCOUNTER — Other Ambulatory Visit: Payer: Self-pay

## 2024-01-08 MED ORDER — GLIPIZIDE ER 10 MG PO TB24
10.0000 mg | ORAL_TABLET | Freq: Two times a day (BID) | ORAL | 1 refills | Status: DC
  Filled 2024-01-08: qty 180, 90d supply, fill #0

## 2024-01-09 ENCOUNTER — Other Ambulatory Visit: Payer: Self-pay

## 2024-01-09 MED ORDER — GLUCOSE BLOOD VI STRP
1.0000 | ORAL_STRIP | Freq: Every day | 11 refills | Status: AC
  Filled 2024-01-09: qty 100, 90d supply, fill #0

## 2024-01-10 ENCOUNTER — Other Ambulatory Visit: Payer: Self-pay

## 2024-01-21 ENCOUNTER — Ambulatory Visit
Admission: RE | Admit: 2024-01-21 | Discharge: 2024-01-21 | Disposition: A | Discharge status: Home / Self Care | Source: Ambulatory Visit | Attending: Internal Medicine | Admitting: Internal Medicine

## 2024-01-21 DIAGNOSIS — Z1231 Encounter for screening mammogram for malignant neoplasm of breast: Secondary | ICD-10-CM | POA: Diagnosis present

## 2024-02-04 ENCOUNTER — Encounter: Payer: Self-pay | Admitting: Dermatology

## 2024-02-04 ENCOUNTER — Ambulatory Visit: Payer: BC Managed Care – PPO | Admitting: Dermatology

## 2024-02-04 DIAGNOSIS — L814 Other melanin hyperpigmentation: Secondary | ICD-10-CM | POA: Diagnosis not present

## 2024-02-04 DIAGNOSIS — W908XXA Exposure to other nonionizing radiation, initial encounter: Secondary | ICD-10-CM

## 2024-02-04 DIAGNOSIS — E669 Obesity, unspecified: Secondary | ICD-10-CM | POA: Insufficient documentation

## 2024-02-04 DIAGNOSIS — Z1283 Encounter for screening for malignant neoplasm of skin: Secondary | ICD-10-CM | POA: Diagnosis not present

## 2024-02-04 DIAGNOSIS — E118 Type 2 diabetes mellitus with unspecified complications: Secondary | ICD-10-CM | POA: Insufficient documentation

## 2024-02-04 DIAGNOSIS — L578 Other skin changes due to chronic exposure to nonionizing radiation: Secondary | ICD-10-CM

## 2024-02-04 DIAGNOSIS — D229 Melanocytic nevi, unspecified: Secondary | ICD-10-CM

## 2024-02-04 DIAGNOSIS — D485 Neoplasm of uncertain behavior of skin: Secondary | ICD-10-CM

## 2024-02-04 DIAGNOSIS — L821 Other seborrheic keratosis: Secondary | ICD-10-CM

## 2024-02-04 DIAGNOSIS — Z87898 Personal history of other specified conditions: Secondary | ICD-10-CM | POA: Insufficient documentation

## 2024-02-04 DIAGNOSIS — I10 Essential (primary) hypertension: Secondary | ICD-10-CM | POA: Insufficient documentation

## 2024-02-04 DIAGNOSIS — D1801 Hemangioma of skin and subcutaneous tissue: Secondary | ICD-10-CM

## 2024-02-04 DIAGNOSIS — J329 Chronic sinusitis, unspecified: Secondary | ICD-10-CM | POA: Insufficient documentation

## 2024-02-04 DIAGNOSIS — Z9109 Other allergy status, other than to drugs and biological substances: Secondary | ICD-10-CM | POA: Insufficient documentation

## 2024-02-04 DIAGNOSIS — F32A Depression, unspecified: Secondary | ICD-10-CM | POA: Insufficient documentation

## 2024-02-04 NOTE — Progress Notes (Signed)
   Follow-Up Visit   Subjective  Alyssa Moses is a 58 y.o. female who presents for the following: Skin Cancer Screening and Full Body Skin Exam. No spots that patient is currently concerned with.   The patient presents for Total-Body Skin Exam (TBSE) for skin cancer screening and mole check.   The following portions of the chart were reviewed this encounter and updated as appropriate: medications, allergies, medical history  Review of Systems:  No other skin or systemic complaints except as noted in HPI or Assessment and Plan.  Objective  Well appearing patient in no apparent distress; mood and affect are within normal limits.  A full examination was performed including scalp, head, eyes, ears, nose, lips, neck, chest, axillae, abdomen, back, buttocks, bilateral upper extremities, bilateral lower extremities, hands, feet, fingers, toes, fingernails, and toenails. All findings within normal limits.    Relevant physical exam findings are noted in the Assessment and Plan.    Assessment & Plan   SKIN CANCER SCREENING PERFORMED TODAY.  ACTINIC DAMAGE - Chronic condition, secondary to cumulative UV/sun exposure - diffuse scaly erythematous macules with underlying dyspigmentation - Recommend daily broad spectrum sunscreen SPF 30+ to sun-exposed areas, reapply every 2 hours as needed.  - Staying in the shade or wearing long sleeves, sun glasses (UVA+UVB protection) and wide brim hats (4-inch brim around the entire circumference of the hat) are also recommended for sun protection.  - Call for new or changing lesions.  LENTIGINES, SEBORRHEIC KERATOSES, HEMANGIOMAS - Benign normal skin lesions - Benign-appearing - Call for any changes  Neoplasm uncertain behaviour     - Slightly irregular brow macule left shoulder 6mm; recheck in 6 months.   MELANOCYTIC NEVI - Tan-brown and/or pink-flesh-colored symmetric macules and papules - Benign appearing on exam today - Observation - Call  clinic for new or changing moles - Recommend daily use of broad spectrum spf 30+ sunscreen to sun-exposed areas.  MULTIPLE BENIGN NEVI   SEBORRHEIC KERATOSES   ACTINIC ELASTOSIS   LENTIGINES   CHERRY ANGIOMA   Return in about 6 months (around 08/06/2024) for Follow up left shoulder.  LILLETTE Rosina Mayans, CMA, am acting as scribe for Boneta Sharps, MD .  Documentation: I have reviewed the above documentation for accuracy and completeness, and I agree with the above.  Boneta Sharps, MD

## 2024-02-04 NOTE — Patient Instructions (Signed)

## 2024-02-19 ENCOUNTER — Other Ambulatory Visit: Payer: Self-pay

## 2024-02-19 MED ORDER — TRULICITY 3 MG/0.5ML ~~LOC~~ SOAJ
3.0000 mg | SUBCUTANEOUS | 2 refills | Status: DC
  Filled 2024-02-19: qty 2, 28d supply, fill #0

## 2024-02-20 ENCOUNTER — Other Ambulatory Visit: Payer: Self-pay

## 2024-02-20 MED ORDER — TRULICITY 1.5 MG/0.5ML ~~LOC~~ SOAJ
1.5000 mg | SUBCUTANEOUS | 0 refills | Status: DC
  Filled 2024-02-20: qty 2, 28d supply, fill #0

## 2024-03-14 ENCOUNTER — Other Ambulatory Visit: Payer: Self-pay

## 2024-03-16 ENCOUNTER — Other Ambulatory Visit: Payer: Self-pay

## 2024-03-16 MED ORDER — DULAGLUTIDE 1.5 MG/0.5ML ~~LOC~~ SOAJ
1.5000 mg | SUBCUTANEOUS | 0 refills | Status: DC
  Filled 2024-03-16: qty 2, 28d supply, fill #0

## 2024-04-08 ENCOUNTER — Other Ambulatory Visit: Payer: Self-pay

## 2024-04-08 MED ORDER — TRULICITY 1.5 MG/0.5ML ~~LOC~~ SOAJ
1.5000 mg | SUBCUTANEOUS | 5 refills | Status: DC
  Filled 2024-04-08: qty 2, 28d supply, fill #0

## 2024-04-09 ENCOUNTER — Other Ambulatory Visit: Payer: Self-pay

## 2024-04-09 MED ORDER — TRULICITY 3 MG/0.5ML ~~LOC~~ SOAJ
3.0000 mg | SUBCUTANEOUS | 1 refills | Status: AC
  Filled 2024-04-09 – 2024-04-17 (×2): qty 6, 84d supply, fill #0
  Filled 2024-07-06: qty 6, 84d supply, fill #1

## 2024-04-10 ENCOUNTER — Other Ambulatory Visit: Payer: Self-pay

## 2024-04-17 ENCOUNTER — Other Ambulatory Visit: Payer: Self-pay

## 2024-06-12 ENCOUNTER — Other Ambulatory Visit: Payer: Self-pay

## 2024-07-06 ENCOUNTER — Other Ambulatory Visit: Payer: Self-pay

## 2024-08-06 ENCOUNTER — Encounter: Payer: Self-pay | Admitting: Dermatology

## 2024-08-06 ENCOUNTER — Ambulatory Visit: Admitting: Dermatology

## 2024-08-06 DIAGNOSIS — L814 Other melanin hyperpigmentation: Secondary | ICD-10-CM

## 2024-08-06 DIAGNOSIS — D229 Melanocytic nevi, unspecified: Secondary | ICD-10-CM | POA: Diagnosis not present

## 2024-08-06 DIAGNOSIS — D485 Neoplasm of uncertain behavior of skin: Secondary | ICD-10-CM | POA: Diagnosis not present

## 2024-08-06 NOTE — Patient Instructions (Signed)
 Recommend daily broad spectrum sunscreen SPF 30+ to sun-exposed areas, reapply every 2 hours as needed. Call for new or changing lesions.  Staying in the shade or wearing long sleeves, sun glasses (UVA+UVB protection) and wide brim hats (4-inch brim around the entire circumference of the hat) are also recommended for sun protection.    Due to recent changes in healthcare laws, you may see results of your pathology and/or laboratory studies on MyChart before the doctors have had a chance to review them. We understand that in some cases there may be results that are confusing or concerning to you. Please understand that not all results are received at the same time and often the doctors may need to interpret multiple results in order to provide you with the best plan of care or course of treatment. Therefore, we ask that you please give us  2 business days to thoroughly review all your results before contacting the office for clarification. Should we see a critical lab result, you will be contacted sooner.   If You Need Anything After Your Visit  If you have any questions or concerns for your doctor, please call our main line at [phone] and press option 4 to reach your doctor's medical assistant. If no one answers, please leave a voicemail as directed and we will return your call as soon as possible. Messages left after 4 pm will be answered the following business day.   You may also send us  a message via MyChart. We typically respond to MyChart messages within 1-2 business days.  For prescription refills, please ask your pharmacy to contact our office. Our fax number is [phone].  If you have an urgent issue when the clinic is closed that cannot wait until the next business day, you can page your doctor at the number below.    Please note that while we do our best to be available for urgent issues outside of office hours, we are not available 24/7.   If you have an urgent issue and are  unable to reach us , you may choose to seek medical care at your doctor's office, retail clinic, urgent care center, or emergency room.  If you have a medical emergency, please immediately call 911 or go to the emergency department.  Pager Numbers  - Dr. Hester: [phone]  - Dr. Jackquline: [phone]  - Dr. Claudene: [phone]   - Dr. Raymund: [phone]  In the event of inclement weather, please call our main line at [phone] for an update on the status of any delays or closures.  Dermatology Medication Tips: Please keep the boxes that topical medications come in in order to help keep track of the instructions about where and how to use these. Pharmacies typically print the medication instructions only on the boxes and not directly on the medication tubes.   If your medication is too expensive, please contact our office at [phone] option 4 or send us  a message through MyChart.   We are unable to tell what your co-pay for medications will be in advance as this is different depending on your insurance coverage. However, we may be able to find a substitute medication at lower cost or fill out paperwork to get insurance to cover a needed medication.   If a prior authorization is required to get your medication covered by your insurance company, please allow us  1-2 business days to complete this process.  Drug prices often vary depending on where the prescription is filled and some pharmacies may offer  cheaper prices.  The website www.goodrx.com contains coupons for medications through different pharmacies. The prices here do not account for what the cost may be with help from insurance (it may be cheaper with your insurance), but the website can give you the price if you did not use any insurance.  - You can print the associated coupon and take it with your prescription to the pharmacy.  - You may also stop by our office during regular business hours and pick up a GoodRx coupon  card.  - If you need your prescription sent electronically to a different pharmacy, notify our office through [facility] or by phone at [phone] option 4.     Si Usted Necesita Algo Despus de Su Visita  Tambin puede enviarnos un mensaje a travs de Clinical cytogeneticist. Por lo general respondemos a los mensajes de MyChart en el transcurso de 1 a 2 das hbiles.  Para renovar recetas, por favor pida a su farmacia que se ponga en contacto con nuestra oficina. Randi lakes de fax es [place] [phone].  Si tiene un asunto urgente cuando la clnica est cerrada y que no puede esperar hasta el siguiente da hbil, puede llamar/localizar a su doctor(a) al nmero que aparece a continuacin.   Por favor, tenga en cuenta que aunque hacemos todo lo posible para estar disponibles para asuntos urgentes fuera del horario de [place], no estamos disponibles las 24 horas del da, los 7 [address]  [address] de la [place].   Si tiene un problema urgente y no puede comunicarse con nosotros, puede optar por buscar atencin mdica  en el consultorio de su doctor(a), en una clnica privada, en un centro de atencin urgente o en una sala de emergencias.  Si tiene Engineer, drilling, por favor llame inmediatamente al 911 o vaya a la sala de emergencias.  Nmeros de bper  - Dr. Hester: [phone]  - Dra. Jackquline: [phone]  - Dr. Claudene: [phone]  - Dra. Kitts: [phone]  En caso de inclemencias del [place], por favor llame a nuestra lnea principal al [phone] para una actualizacin sobre el estado de cualquier retraso o cierre.  Consejos para la medicacin en dermatologa: Por favor, guarde las cajas en las que vienen los medicamentos de uso tpico para ayudarle a seguir las instrucciones sobre dnde y cmo usarlos. Las farmacias generalmente imprimen las instrucciones del medicamento slo en las cajas y no directamente en los tubos del [place].   Si su medicamento es muy caro, por favor, pngase  en contacto con landry rieger llamando al [phone] y presione la opcin 4 o envenos un mensaje a travs de Clinical cytogeneticist.   No podemos decirle cul ser su copago por los medicamentos por adelantado ya que esto es diferente dependiendo de la cobertura de su seguro. Sin embargo, es posible que podamos encontrar un medicamento sustituto a Audiological scientist un formulario para que el seguro cubra el medicamento que se considera necesario.   Si se requiere una autorizacin previa para que su compaa de seguros malta su medicamento, por favor permtanos de 1 a 2 das hbiles para completar este proceso.  Los precios de los medicamentos varan con frecuencia dependiendo del Environmental consultant de dnde se surte la receta y alguna farmacias pueden ofrecer precios ms baratos.  El sitio web www.goodrx.com tiene cupones para medicamentos de Health and safety inspector. Los precios aqu no tienen en cuenta lo que podra costar con la ayuda del seguro (puede ser ms barato con su seguro), pero el sitio web puede darle el  precio si no Visual merchandiser.  - Puede imprimir el cupn correspondiente y llevarlo con su receta a la farmacia.  - Tambin puede pasar por nuestra oficina durante el horario de atencin regular y Education officer, museum una tarjeta de cupones de GoodRx.  - Si necesita que su receta se enve electrnicamente a una farmacia diferente, informe a nuestra oficina a travs de MyChart de [place] o por telfono llamando al [phone] y presione la opcin 4.

## 2024-08-06 NOTE — Progress Notes (Signed)
" ° °  Follow-Up Visit   Subjective  Alyssa Moses is a 59 y.o. female who presents for the following: 6 month recheck. Lesion on left posterior shoulder.   The patient has spots, moles and lesions to be evaluated, some may be new or changing and the patient may have concern these could be cancer.     The following portions of the chart were reviewed this encounter and updated as appropriate: medications, allergies, medical history  Review of Systems:  No other skin or systemic complaints except as noted in HPI or Assessment and Plan.  Objective  Well appearing patient in no apparent distress; mood and affect are within normal limits.  A focused examination was performed of the following areas: Back , chest, face, hands, arms  Relevant physical exam findings are noted in the Assessment and Plan.    Assessment & Plan   Neoplasm uncertain behaviour  Slightly irregular brow macule left shoulder 6mm  Stable compared to previous visit. Observation.  Call clinic for new or changing moles.  Recommend daily use of broad spectrum spf 30+ sunscreen to sun-exposed areas.    LENTIGINES Exam: scattered tan macules Due to sun exposure Treatment Plan: Benign-appearing, observe. Recommend daily broad spectrum sunscreen SPF 30+ to sun-exposed areas, reapply every 2 hours as needed.  Call for any changes  MELANOCYTIC NEVI Exam: Tan-brown and/or pink-flesh-colored symmetric macules and papules  Treatment Plan: Benign appearing on exam today. Recommend observation. Call clinic for new or changing moles. Recommend daily use of broad spectrum spf 30+ sunscreen to sun-exposed areas.   LENTIGINES   MULTIPLE BENIGN NEVI   NEOPLASM OF UNCERTAIN BEHAVIOR OF SKIN     Return in about 6 months (around 02/03/2025) for TBSE.  I, Jill Parcell, CMA, am acting as scribe for Boneta Sharps, MD.   Documentation: I have reviewed the above documentation for accuracy and completeness, and I agree  with the above.  Boneta Sharps, MD    "

## 2025-02-04 ENCOUNTER — Ambulatory Visit: Admitting: Dermatology
